# Patient Record
Sex: Male | Born: 1960 | Marital: Married
Health system: Southern US, Community
[De-identification: ages and names within clinical notes are randomized; demographics above are authoritative.]

---

## 2011-11-25 ENCOUNTER — Inpatient Hospital Stay: Payer: Self-pay | Admitting: Internal Medicine

## 2011-11-27 LAB — BASIC METABOLIC PANEL
Anion Gap: 11 (ref 7–16)
BUN: 8 mg/dL (ref 7–18)
Calcium, Total: 8 mg/dL — ABNORMAL LOW (ref 8.5–10.1)
Chloride: 112 mmol/L — ABNORMAL HIGH (ref 98–107)
Co2: 24 mmol/L (ref 21–32)
Creatinine: 0.71 mg/dL (ref 0.60–1.30)
EGFR (African American): >60
EGFR (Non-African Amer.): >60
Glucose: 93 mg/dL (ref 65–99)
Osmolality: 290 (ref 275–301)
Potassium: 3.9 mmol/L (ref 3.5–5.1)
Sodium: 147 mmol/L — ABNORMAL HIGH (ref 136–145)

## 2011-11-27 LAB — CBC WITH DIFFERENTIAL/PLATELET
Bands: 10 %
Comment - H1-Com1: NORMAL
Comment - H1-Com2: NORMAL
HCT: 36.5 % — ABNORMAL LOW (ref 40.0–52.0)
HGB: 12.5 g/dL — ABNORMAL LOW (ref 13.0–18.0)
MCH: 33.9 pg (ref 26.0–34.0)
MCHC: 34.2 g/dL (ref 32.0–36.0)
Monocytes: 17 %
Other Cells Blood: 100
Platelet: 120 10*3/uL — ABNORMAL LOW (ref 150–440)

## 2011-11-27 LAB — PHENYTOIN LEVEL, TOTAL: Dilantin: 12.1 ug/mL (ref 10.0–20.0)

## 2011-11-28 LAB — PHENYTOIN LEVEL, TOTAL: Dilantin: 11.7 ug/mL (ref 10.0–20.0)

## 2011-11-29 LAB — MAGNESIUM: Magnesium: 1.6 mg/dL — ABNORMAL LOW

## 2011-11-29 LAB — BASIC METABOLIC PANEL
BUN: 4 mg/dL — ABNORMAL LOW (ref 7–18)
Calcium, Total: 8.7 mg/dL (ref 8.5–10.1)
Chloride: 109 mmol/L — ABNORMAL HIGH (ref 98–107)
Co2: 25 mmol/L (ref 21–32)
EGFR (Non-African Amer.): >60
Glucose: 83 mg/dL (ref 65–99)
Osmolality: 283 (ref 275–301)
Potassium: 3.5 mmol/L (ref 3.5–5.1)
Sodium: 144 mmol/L (ref 136–145)

## 2011-11-29 LAB — PHOSPHORUS: Phosphorus: 1.4 mg/dL — ABNORMAL LOW (ref 2.5–4.9)

## 2011-11-30 DIAGNOSIS — I059 Rheumatic mitral valve disease, unspecified: Secondary | ICD-10-CM

## 2011-11-30 LAB — BASIC METABOLIC PANEL
Anion Gap: 10 (ref 7–16)
Calcium, Total: 8.2 mg/dL — ABNORMAL LOW (ref 8.5–10.1)
Creatinine: 0.52 mg/dL — ABNORMAL LOW (ref 0.60–1.30)
EGFR (African American): >60
EGFR (Non-African Amer.): >60
Glucose: 141 mg/dL — ABNORMAL HIGH (ref 65–99)
Osmolality: 286 (ref 275–301)
Sodium: 144 mmol/L (ref 136–145)

## 2011-11-30 LAB — PHOSPHORUS: Phosphorus: 2.8 mg/dL (ref 2.5–4.9)

## 2011-11-30 LAB — MAGNESIUM: Magnesium: 1.6 mg/dL — ABNORMAL LOW

## 2011-11-30 LAB — ALBUMIN: Albumin: 2.1 g/dL — ABNORMAL LOW (ref 3.4–5.0)

## 2011-11-30 LAB — PHENYTOIN LEVEL, TOTAL: Dilantin: 10.6 ug/mL (ref 10.0–20.0)

## 2011-12-01 LAB — BASIC METABOLIC PANEL
BUN: 5 mg/dL — ABNORMAL LOW (ref 7–18)
EGFR (African American): >60
Glucose: 123 mg/dL — ABNORMAL HIGH (ref 65–99)
Osmolality: 278 (ref 275–301)
Potassium: 3.1 mmol/L — ABNORMAL LOW (ref 3.5–5.1)

## 2011-12-01 LAB — CBC WITH DIFFERENTIAL/PLATELET
Basophil %: 0.3 %
Eosinophil #: 0 10*3/uL (ref 0.0–0.7)
HCT: 34.5 % — ABNORMAL LOW (ref 40.0–52.0)
HGB: 11.9 g/dL — ABNORMAL LOW (ref 13.0–18.0)
Lymphocyte #: 0.4 10*3/uL — ABNORMAL LOW (ref 1.0–3.6)
Lymphocyte %: 5.4 %
MCH: 33.8 pg (ref 26.0–34.0)
MCHC: 34.6 g/dL (ref 32.0–36.0)
MCV: 98 fL (ref 80–100)
Monocyte #: 1.5 10*3/uL — ABNORMAL HIGH (ref 0.0–0.7)
Neutrophil #: 5.9 10*3/uL (ref 1.4–6.5)
Platelet: 221 10*3/uL (ref 150–440)

## 2011-12-01 LAB — POTASSIUM: Potassium: 3.8 mmol/L (ref 3.5–5.1)

## 2011-12-01 LAB — MAGNESIUM: Magnesium: 2.4 mg/dL

## 2011-12-02 LAB — BASIC METABOLIC PANEL
Anion Gap: 10 (ref 7–16)
BUN: 7 mg/dL (ref 7–18)
Chloride: 110 mmol/L — ABNORMAL HIGH (ref 98–107)
Creatinine: 0.58 mg/dL — ABNORMAL LOW (ref 0.60–1.30)
EGFR (African American): >60
EGFR (Non-African Amer.): >60
Glucose: 101 mg/dL — ABNORMAL HIGH (ref 65–99)
Osmolality: 291 (ref 275–301)
Potassium: 3.8 mmol/L (ref 3.5–5.1)

## 2011-12-02 LAB — PHENYTOIN LEVEL, TOTAL: Dilantin: 5.7 ug/mL — ABNORMAL LOW (ref 10.0–20.0)

## 2011-12-02 LAB — HEMOGLOBIN: HGB: 11.9 g/dL — ABNORMAL LOW (ref 13.0–18.0)

## 2011-12-03 LAB — BASIC METABOLIC PANEL
Anion Gap: 9 (ref 7–16)
BUN: 7 mg/dL (ref 7–18)
Calcium, Total: 8.6 mg/dL (ref 8.5–10.1)
Creatinine: 0.69 mg/dL (ref 0.60–1.30)
EGFR (Non-African Amer.): >60
Glucose: 112 mg/dL — ABNORMAL HIGH (ref 65–99)
Osmolality: 287 (ref 275–301)
Potassium: 3.3 mmol/L — ABNORMAL LOW (ref 3.5–5.1)

## 2011-12-03 LAB — ALBUMIN: Albumin: 2.3 g/dL — ABNORMAL LOW (ref 3.4–5.0)

## 2011-12-03 LAB — POTASSIUM: Potassium: 4.5 mmol/L (ref 3.5–5.1)

## 2011-12-04 LAB — PHENYTOIN LEVEL, TOTAL: Dilantin: 5.3 ug/mL — ABNORMAL LOW (ref 10.0–20.0)

## 2011-12-04 LAB — CK: CK, Total: 24 U/L — ABNORMAL LOW (ref 35–232)

## 2011-12-04 LAB — BASIC METABOLIC PANEL
Calcium, Total: 9 mg/dL (ref 8.5–10.1)
Chloride: 107 mmol/L (ref 98–107)
Co2: 27 mmol/L (ref 21–32)
Creatinine: 0.61 mg/dL (ref 0.60–1.30)
EGFR (African American): >60
Glucose: 123 mg/dL — ABNORMAL HIGH (ref 65–99)
Potassium: 3.5 mmol/L (ref 3.5–5.1)
Sodium: 144 mmol/L (ref 136–145)

## 2011-12-05 LAB — CBC WITH DIFFERENTIAL/PLATELET
Eosinophil #: 0.1 10*3/uL (ref 0.0–0.7)
Eosinophil %: 0.9 %
HGB: 11.6 g/dL — ABNORMAL LOW (ref 13.0–18.0)
Lymphocyte #: 1.2 10*3/uL (ref 1.0–3.6)
MCH: 33.3 pg (ref 26.0–34.0)
MCHC: 33.8 g/dL (ref 32.0–36.0)
MCV: 98 fL (ref 80–100)
Monocyte #: 1 10*3/uL — ABNORMAL HIGH (ref 0.0–0.7)
Monocyte %: 7.4 %
Neutrophil #: 11.3 10*3/uL — ABNORMAL HIGH (ref 1.4–6.5)
Platelet: 420 10*3/uL (ref 150–440)
RDW: 16.2 % — ABNORMAL HIGH (ref 11.5–14.5)
WBC: 13.8 10*3/uL — ABNORMAL HIGH (ref 3.8–10.6)

## 2011-12-05 LAB — BASIC METABOLIC PANEL
Anion Gap: 10 (ref 7–16)
BUN: 6 mg/dL — ABNORMAL LOW (ref 7–18)
Calcium, Total: 8.7 mg/dL (ref 8.5–10.1)
Co2: 26 mmol/L (ref 21–32)
Creatinine: 0.65 mg/dL (ref 0.60–1.30)
Glucose: 106 mg/dL — ABNORMAL HIGH (ref 65–99)
Osmolality: 285 (ref 275–301)
Sodium: 144 mmol/L (ref 136–145)

## 2011-12-06 LAB — CBC WITH DIFFERENTIAL/PLATELET
Basophil #: 0 10*3/uL (ref 0.0–0.1)
Basophil %: 0.3 %
HGB: 11.4 g/dL — ABNORMAL LOW (ref 13.0–18.0)
Lymphocyte #: 1.2 10*3/uL (ref 1.0–3.6)
Lymphocyte %: 11.9 %
MCH: 33.3 pg (ref 26.0–34.0)
MCHC: 33.7 g/dL (ref 32.0–36.0)
MCV: 99 fL (ref 80–100)
Monocyte %: 8 %
Neutrophil #: 7.9 10*3/uL — ABNORMAL HIGH (ref 1.4–6.5)
Neutrophil %: 78.4 %
RBC: 3.4 10*6/uL — ABNORMAL LOW (ref 4.40–5.90)
WBC: 10.1 10*3/uL (ref 3.8–10.6)

## 2011-12-06 LAB — BASIC METABOLIC PANEL
Anion Gap: 8 (ref 7–16)
BUN: 7 mg/dL (ref 7–18)
Calcium, Total: 9.1 mg/dL (ref 8.5–10.1)
EGFR (African American): >60
EGFR (Non-African Amer.): >60
Glucose: 104 mg/dL — ABNORMAL HIGH (ref 65–99)
Osmolality: 285 (ref 275–301)
Sodium: 144 mmol/L (ref 136–145)

## 2011-12-06 LAB — CK TOTAL AND CKMB (NOT AT ARMC): CK-MB: <0.5 ng/mL — ABNORMAL LOW (ref 0.5–3.6)

## 2011-12-07 LAB — CBC WITH DIFFERENTIAL/PLATELET
Basophil #: 0 10*3/uL (ref 0.0–0.1)
Basophil %: 0.3 %
Eosinophil #: 0 10*3/uL (ref 0.0–0.7)
Eosinophil %: 0.5 %
HCT: 34.4 % — ABNORMAL LOW (ref 40.0–52.0)
HGB: 11.8 g/dL — ABNORMAL LOW (ref 13.0–18.0)
Lymphocyte %: 11.4 %
MCH: 33.4 pg (ref 26.0–34.0)
MCHC: 34.2 g/dL (ref 32.0–36.0)
Monocyte #: 0.7 10*3/uL (ref 0.0–0.7)
Neutrophil #: 7.1 10*3/uL — ABNORMAL HIGH (ref 1.4–6.5)
Neutrophil %: 79.6 %
RBC: 3.53 10*6/uL — ABNORMAL LOW (ref 4.40–5.90)

## 2011-12-07 LAB — BASIC METABOLIC PANEL
Anion Gap: 13 (ref 7–16)
BUN: 9 mg/dL (ref 7–18)
Calcium, Total: 8.9 mg/dL (ref 8.5–10.1)
Chloride: 109 mmol/L — ABNORMAL HIGH (ref 98–107)
EGFR (Non-African Amer.): >60
Glucose: 94 mg/dL (ref 65–99)
Osmolality: 285 (ref 275–301)

## 2011-12-07 LAB — TROPONIN I
Troponin-I: 0.03 ng/mL
Troponin-I: 0.04 ng/mL

## 2011-12-08 LAB — CBC WITH DIFFERENTIAL/PLATELET
Basophil %: 0.5 %
Eosinophil #: 0.1 10*3/uL (ref 0.0–0.7)
Eosinophil %: 0.9 %
HCT: 32.9 % — ABNORMAL LOW (ref 40.0–52.0)
HGB: 11.2 g/dL — ABNORMAL LOW (ref 13.0–18.0)
Lymphocyte #: 1.2 10*3/uL (ref 1.0–3.6)
Lymphocyte %: 14.5 %
MCH: 33.2 pg (ref 26.0–34.0)
Monocyte #: 0.9 10*3/uL — ABNORMAL HIGH (ref 0.0–0.7)
Monocyte %: 11 %
Neutrophil #: 5.9 10*3/uL (ref 1.4–6.5)
Neutrophil %: 73.1 %
Platelet: 606 10*3/uL — ABNORMAL HIGH (ref 150–440)
RBC: 3.37 10*6/uL — ABNORMAL LOW (ref 4.40–5.90)
WBC: 8 10*3/uL (ref 3.8–10.6)

## 2011-12-08 LAB — BASIC METABOLIC PANEL
Anion Gap: 12 (ref 7–16)
BUN: 10 mg/dL (ref 7–18)
Chloride: 107 mmol/L (ref 98–107)
Creatinine: 0.76 mg/dL (ref 0.60–1.30)
EGFR (African American): >60
EGFR (Non-African Amer.): >60
Glucose: 116 mg/dL — ABNORMAL HIGH (ref 65–99)
Osmolality: 291 (ref 275–301)
Sodium: 146 mmol/L — ABNORMAL HIGH (ref 136–145)

## 2011-12-08 LAB — TROPONIN I: Troponin-I: 0.04 ng/mL

## 2011-12-08 LAB — EXPECTORATED SPUTUM ASSESSMENT W GRAM STAIN, RFLX TO RESP C

## 2011-12-09 LAB — BASIC METABOLIC PANEL
Anion Gap: 10 (ref 7–16)
Calcium, Total: 9.6 mg/dL (ref 8.5–10.1)
Chloride: 107 mmol/L (ref 98–107)
Co2: 26 mmol/L (ref 21–32)
Creatinine: 0.65 mg/dL (ref 0.60–1.30)
EGFR (African American): >60
EGFR (Non-African Amer.): >60
Osmolality: 283 (ref 275–301)
Potassium: 3.3 mmol/L — ABNORMAL LOW (ref 3.5–5.1)
Sodium: 143 mmol/L (ref 136–145)

## 2011-12-09 LAB — CBC WITH DIFFERENTIAL/PLATELET
Basophil #: 0.1 10*3/uL (ref 0.0–0.1)
Basophil %: 1.6 %
HCT: 35.5 % — ABNORMAL LOW (ref 40.0–52.0)
Lymphocyte #: 1.5 10*3/uL (ref 1.0–3.6)
Lymphocyte %: 20.4 %
MCH: 32.6 pg (ref 26.0–34.0)
MCV: 98 fL (ref 80–100)
Monocyte %: 14.1 %
Neutrophil #: 4.4 10*3/uL (ref 1.4–6.5)
RDW: 14.8 % — ABNORMAL HIGH (ref 11.5–14.5)
WBC: 7.1 10*3/uL (ref 3.8–10.6)

## 2011-12-09 LAB — CLOSTRIDIUM DIFFICILE BY PCR

## 2011-12-10 LAB — BASIC METABOLIC PANEL
Anion Gap: 10 (ref 7–16)
BUN: 10 mg/dL (ref 7–18)
Calcium, Total: 9.8 mg/dL (ref 8.5–10.1)
Chloride: 106 mmol/L (ref 98–107)
Co2: 23 mmol/L (ref 21–32)
Creatinine: 0.63 mg/dL (ref 0.60–1.30)
Osmolality: 276 (ref 275–301)
Potassium: 3.8 mmol/L (ref 3.5–5.1)

## 2012-08-02 IMAGING — CR DG CHEST 1V PORT
1 series · 1 of 1 positions shown · non-contrast
Comparison: none

REASON FOR EXAM: seizure ams
COMMENTS:

PROCEDURE:     DXR - DXR PORTABLE CHEST SINGLE VIEW  - November 25, 2011 [DATE]
RESULT:     Comparison: None

[portable]
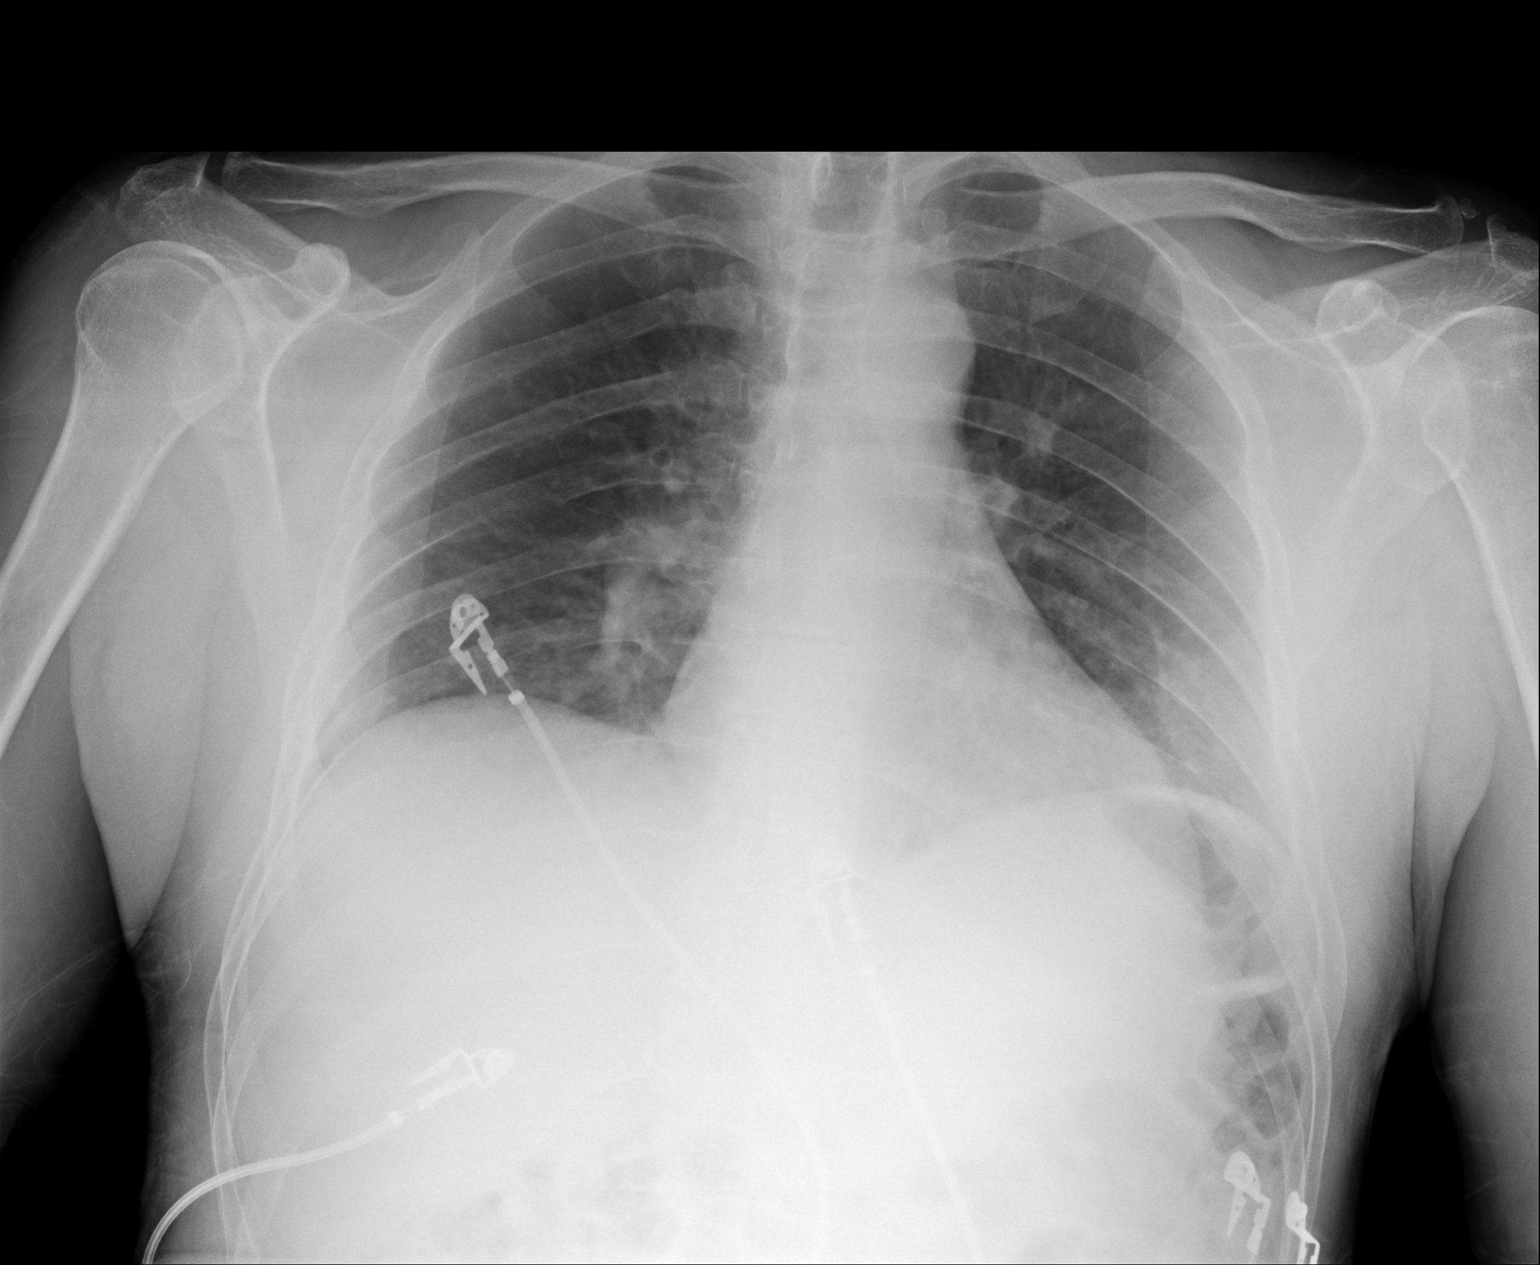

[1 of 1 positions shown; findings below may reference images not displayed]

FINDINGS: Single portable AP chest radiograph is provided.  There is no focal
parenchymal opacity, pleural effusion, or pneumothorax. Normal
cardiomediastinal silhouette. The osseous structures are unremarkable.
IMPRESSION: No acute disease of the chest.

## 2012-08-12 IMAGING — CR DG CHEST 1V PORT
1 series · 1 of 1 positions shown · non-contrast
Comparison: none

REASON FOR EXAM: iinterval changes
COMMENTS:

[portable]
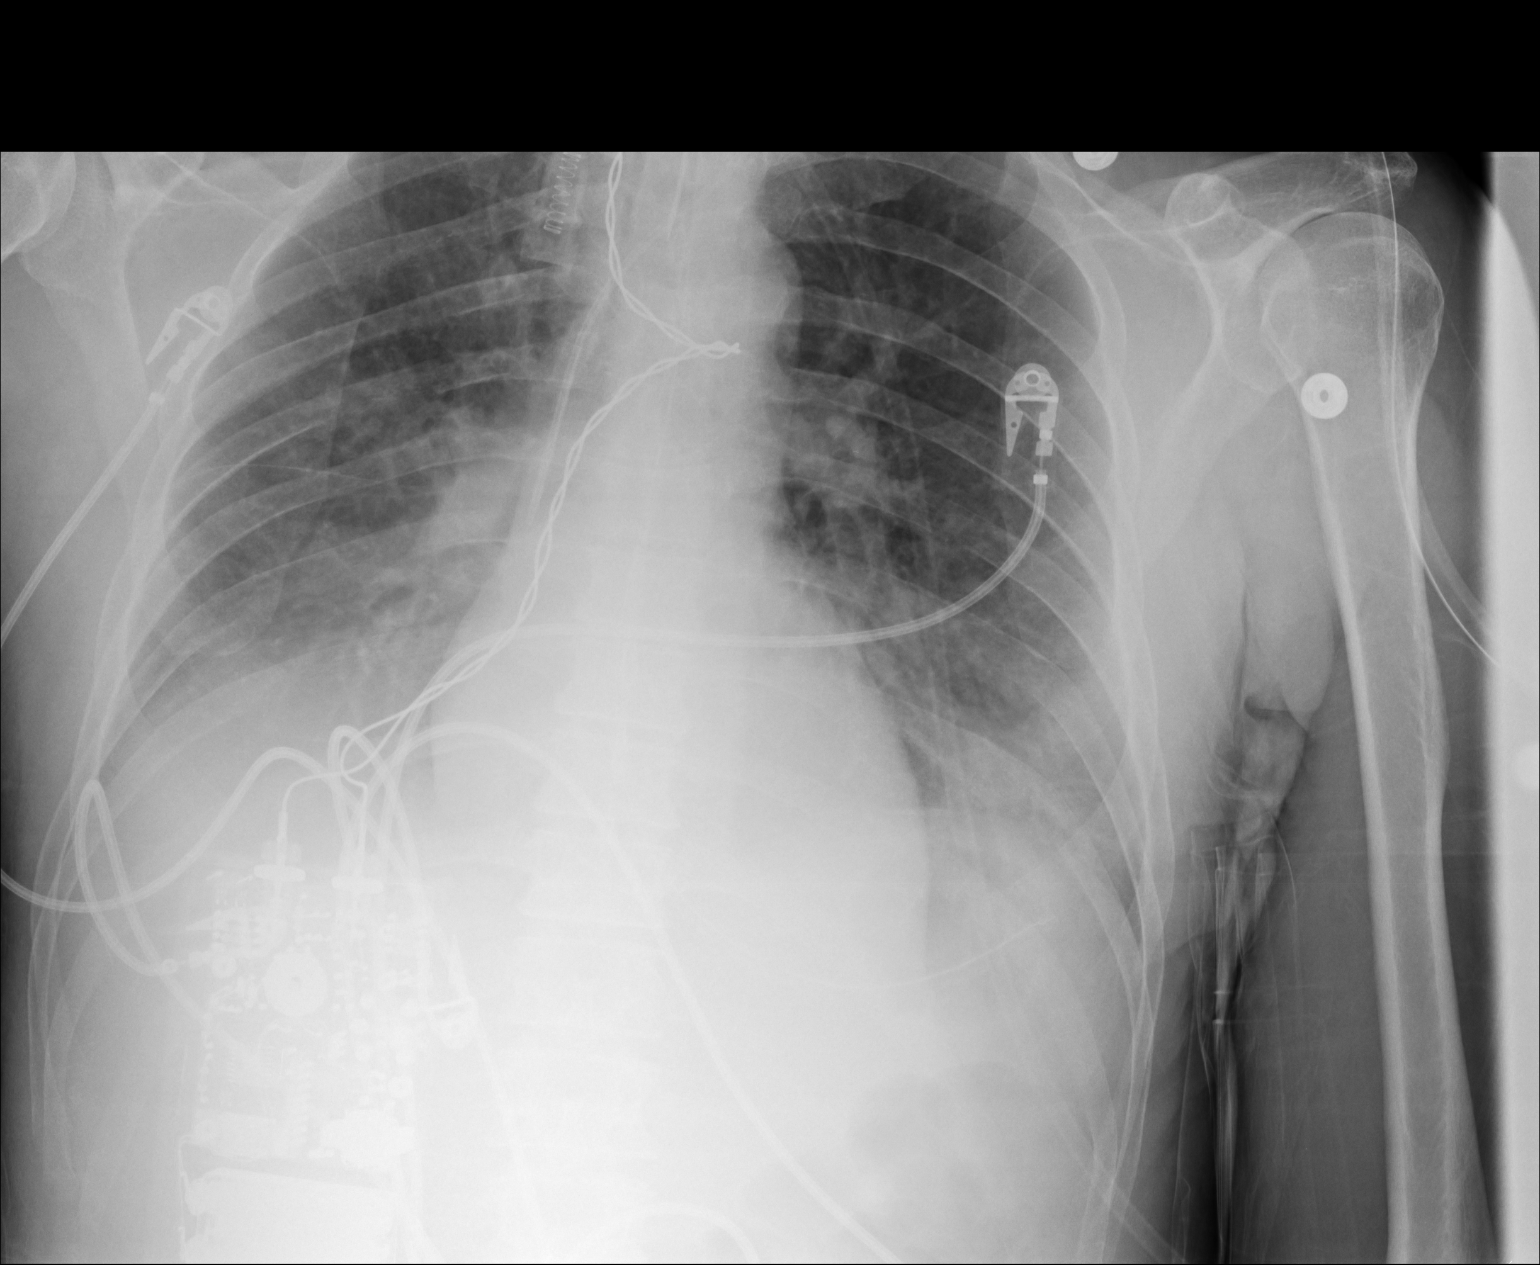

[1 of 1 positions shown; findings below may reference images not displayed]

PROCEDURE:     DXR - DXR PORTABLE CHEST SINGLE VIEW  - December 05, 2011 [DATE]

RESULT:     Comparison is made to the prior exam of 12/03/2011. There is
increased density at both lung bases, more prominent on the left and
compatible with atelectasis or pneumonia. Heart size is normal. No pulmonary
edema is seen. An endotracheal tube is present with the tip approximately
4.3 cm above the carina. A venous catheter is present with the distal
portion projected over the inferior aspect of the superior vena cava.
Monitoring electrodes are noted.
IMPRESSION: 1. There is a hazy increase in density in both lower lobes compatible with
atelectasis although early pneumonia cannot be entirely excluded at this
point.
2. Support lines are noted as mentioned above.

## 2015-03-20 NOTE — Consult Note (Signed)
PATIENT NAME:  Richard Dougherty, Richard Dougherty MR#:  [id number] DATE OF BIRTH:  [birth year]  DATE OF CONSULTATION:  11/25/2011  REFERRING PHYSICIAN:  Gladstone Lighter, MD CONSULTING PHYSICIAN:  Rudell Cobb. 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 He is referred for evaluation of seizures. History comes from his [facility] records and conversation with his wife.   In the late evening of 11/24/2011, the patient was witnessed by family members to fall on the floor backward with blow to the back of the head and to have tonic-clonic generalized seizure including bite of the tongue. He had fluid coming from the mouth so he was placed onto his side to avoid aspiration. After the seizure, he was lethargic. In the emergency room, he was intubated to protect his airway in the setting of described significant agitation with confused and combative behavior. In the Critical Care Unit, he has required significant sedation with IV Ativan, fentanyl, and Versed with continued tremors to the upper extremities. Brain CT scan was reported benign.   His wife reports that he has long history of alcohol abuse and this has been significantly increased in recent years in the setting of his wife's diagnosis of breast cancer in 2007 with treatment ending in 2008, in the setting of economic downturn and financial stressors. He drinks beer.  She does not know how many beers he has a day, but reports that his credit card records indicate spending 30 dollars a day on beer. He also uses NyQuil off and on during the day and Benadryl. It is reported that in recent months he does not go an hour without a beer. He last had beer the morning of Thursday, 11/22/2011, before 9 o'clock  a.m. beginning of car trip to drive down for visit with family  members in [place]. It is reported he has had an increase over his baseline level of tremor on the evening of 11/22/2011 when met by family members here. He spent the day in bed on 11/23/2011. On 11/24/2011 he was noted to have more tremor, and then had a witnessed seizure in the evening. His wife reports that he had a one week period of alcohol detoxification rehab approximately two years prior to admission. He has no prior history of seizures. He has no prior history of cold [place] discontinuation of alcohol. It is not clear whether he also discontinued NyQuil and Benadryl during the two days prior to admission.   PAST MEDICAL HISTORY: He started a medication for hypertension a couple of years ago. He had not had problems with this medication for two weeks prior to admission after running out of medication. He has not had other medical problems. He is on no regular medications aside from his blood pressure medication. He does take a daily multivitamin.   HABITS: He is a nonsmoker and has history of alcohol abuse as noted above.  DRUG ALLERGIES: No known drug allergies.   SOCIAL/FAMILY HISTORY: He is a Restaurant manager, fast food and lives with his wife of 21 years in [place], [place]. They have 13 and 95 year-old daughters. He is one of eight brothers, one of five who are chiropractors. Two brothers have passed away. One shortly after birth and the other at age 29 from  myocardial infarction. There is history of alcohol problems in the patient's father who is  now 30 and history of death at age 74 of the patient's father's father, concluded secondary to the effects of alcohol intoxication.   PHYSICAL EXAMINATION: The patient is a well developed, well nourished, white gentleman who was examined lying semisupine in the Critical Care Unit on the ventilator with respiratory rate 20 and IMV rate from the ventilator at 14 breaths per minute. Blood pressure is 114/68 with heart rate 86. He was afebrile. There was bruise  of the tongue. Head was otherwise normocephalic without evidence of trauma. His neck was supple to passive movement. Examination was notable for near continual tremor of the hands, greater transiently after stimulations. Pupils were equal and normally reactive from 3 to 2 mm. Cortical responses were present. Doll's response was mildly present. Some facial grimace was seen bilaterally to intranasal noxious stimulation. There were no elicitable voluntary movements of the extremities. Tongue was mildly increased throughout. Reflexes were rated 2+ throughout.   IMPRESSION:  1. Clinical picture most consistent with alcoholic withdrawal seizure.  2. Encephalopathy at the time of admission secondary to a combination of postictal state, expect concussion associated with closed head injury from fall, and alcohol withdrawal. 3. At present he is very poorly responsive in the setting of significant sedation.   RECOMMENDATIONS:  1. He will be loaded with IV phenytoin to cover the possibility of withdrawal seizures for the present. At this point his clinical picture is most consistent with acute symptomatic seizures from alcohol withdrawal, so that at discharge he will be given a schedule to taper off of Dilantin.  2. I agree with his present work-up and treatment including alcohol withdrawal protocol.  3. He will need psychiatric evaluation and treatment.  4. I do not see any indication at this time for lumbar puncture.  5. I will follow his course in the hospital. At this point I do not see any indication for repeat brain CT scan while on the ventilator and do not see indication for EEG. Once he is off the ventilator, he should have brain MRI scan.   I appreciate being asked to see this interesting gentleman. ____________________________ Rudell Cobb. Loletta Specter, MD prc:slb D: 11/26/2011 09:50:00 ET T: 11/26/2011 11:04:47 ET JOB#: [id number]  cc: Rudell Cobb. Loletta Specter, MD, <Dictator> Linton Flemings MD ELECTRONICALLY SIGNED  11/29/2011 10:17

## 2015-03-20 NOTE — Discharge Summary (Signed)
PATIENT NAME:  Richard Dougherty, Richard Dougherty MR#:  [id number] DATE OF BIRTH:  [birth year]  DATE OF ADMISSION:  11/25/2011 DATE OF DISCHARGE:  12/11/2011  PRIMARY CARE PHYSICIAN: Nonlocal, in [place].   REASON FOR ADMISSION: Seizures.   DISCHARGE DIAGNOSES:  1. Alcohol withdrawal seizures.  2. Severe delirium tremens.  3. Alcohol withdrawal.  4. Acute respiratory failure secondary to inability to protect airway due to severe delirium tremens and alcohol withdrawal seizures requiring intubation/mechanical ventilation. The patient was successfully extubated on 12/06/2011.  5. Methicillin-sensitive Staphylococcus aureus pneumonia, possibly aspiration versus ventilator-associated pneumonia.  6. 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  7. Diarrhea felt to be from antibiotic side effect.  Clostridium difficile negative. Diarrhea, resolved.  8. Alcohol abuse.   9. 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  10. History of alcohol abuse.   11. History of anxiety.   DISCHARGE DISPOSITION: Home.   DISCHARGE MEDICATIONS:  1. Metoprolol 50 mg p.o. q.12 hours.  2. Norvasc 5 mg.   3. Thiamine 100 mg p.o. daily.   4. Folate 1 mg p.o. daily.   5. Multivitamin 1 tablet p.o. daily.   6. Ativan 1 mg p.o. q.8 hours p.r.n. x1 week until followup with primary M.D.   DISCHARGE ACTIVITY: As tolerated.   DISCHARGE DIET: Low sodium, low fat, low cholesterol.   DISCHARGE INSTRUCTIONS:  1. Take medication prescribed.  2. Return to the emergency department for symptoms.   3. Do not consume any alcohol.   FOLLOWUP INSTRUCTIONS:  1. Follow up with your primary M.D. in [place] within 1 to 2 weeks.  Patient's primary M.D. 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  2. Patient instructed to enroll in outpatient alcohol rehab program in [place] as soon as possible.     CONSULTANTS  DURING THIS HOSPITALIZATION:  1. Pulmonary critical care with Dr Belia HemanKasa.   2. Vascular surgery with Dr. Wyn Quakerew for central line insertion.  3. Neurology consultation with Dr. Kemper Durielarke.   4. Psychiatric consultation with Dr. Toni Amendlapacs.   PROCEDURES: None.   PROCEDURES AND PERTINENT LABORATORY DATA: Please refer to interim discharge summary dictated by Dr. Nemiah CommanderKalisetti from 12/07/2011. Additional laboratories and studies as follows:  EEG 12/05/2011, abnormal EEG due to poorly formed background activity and delta swelling.  Possible epileptiform discharges in left temporal region could be muscle artifact and epileptiform discharges were not associated with jerking movements.   BMP normal from 12/10/2011.    Cardiac enzymes are negative x3 sets.   C. difficile toxin was negative from 12/06/2011 and 10/13/201.  Sputum culture 12/04/2011,  moderate growth of methicillin susceptible Staphylococcus aureus. Blood cultures x2 from 11/27/2011 no growth to date. Echocardiogram 11/30/2011, LV is not well visualized; LV systolic function is mildly reduced; ejection fraction 40% to 45%.  RV systolic function is normal.  Left atrium size is normal. RV systolic pressure is normal.   BRIEF HISTORY/HOSPITAL COURSE: The patient is a 54 year old male with past medical history of severe alcohol abuse and hypertension who presented to the emergency department secondary to seizures. Please see dictated admission history and physical for pertinent details surrounding the onset of this hospitalization. Additionally, please refer to Dr. Prudencio PairKalisetti's dictated interim discharge summary from 12/07/2011 for details surrounding the majority of patient's hospitalization. This summary serves to provide additional details from the time when I was rounding on the patient from 12/08/2011 to the day of discharge (12/11/2011). Please see below.  Seizures  and severe DTs from alcohol withdrawal with acute respiratory failure and inability to protect  airway due to severe DTs, alcohol withdrawal, and seizures requiring intubation and mechanical ventilation for which the patient was followed by Pulmonary and Critical Care and was successfully extubated 12/06/2011.   He was kept on benzodiazepine therapy for his alcohol withdrawal, seizures, and DTs. He has done well with benzodiazepines. He has been successfully detoxed and did not require much Ativan at all at this time. He was maintained on Ativan per CIWA, and benzodiazepines are currently being tapered. He was followed by Psychiatry and Neurology as well. Initial EEG showed evidence of toxic metabolic encephalopathy versus post ictal phase; and repeat EEG revealed an epileptiform focus not corresponding to whole body jerking, and this was felt to be artifactual and possibly from tremors and less likely to be true underlying seizure disorder per Neurology, and Neurology felt that patient had alcohol withdrawal seizures rather than a true seizure disorder and thereafter Keppra was discontinued and Dr. Kemper Durie did not feel that patient needed any maintenance antiepileptic therapy and was in agreement with continuation of benzodiazepine taper until patient was successfully detoxed from alcohol.   The patient has detoxed well. He was advised to no longer consume alcohol again in the future, will need to enroll in outpatient alcohol rehabilitation program, which his wife is helping to arrange in [place].  Patient is agreeable to going to alcohol rehab. He will also be on thiamine, folate, and multivitamin therapy for now given his history of alcohol abuse. Overall psychiatry, neurology as well as pulmonary critical care and all specialists were in agreement with overall medical management plan.   Patient also was noted to have methicillin susceptible Staphylococcus aureus pneumonia. This was felt to be either aspiration versus ventilator associated pneumonia. The patient has completed appropriate  antibiotics. He was initially on vancomycin and Zosyn, and after cultures were reviewed he was switched to oral Augmentin, and methicillin susceptible Staphylococcus aureus was sensitive to vancomycin, Zosyn, as well as amoxicillin. The patient has completed her appropriate antibiotics and has completed a 10 day course of inpatient antibiotics and his pneumonia has clinically resolved.   Malignant hypertension. This has also resolved and this is due to alcohol withdrawal. Blood pressure is now at goal on the day of discharge after adjusting medications, and he will continue metoprolol, Norvasc as an outpatient and his alcohol withdrawal was managed with benzodiazepines as above.   Diarrhea, short lasted, felt be antibiotic side effect. Clostridium difficile was negative, and diarrhea has resolved.   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 The patient is currently well compensated and he is currently euvolemic, therefore does not require diuretics at this time and did not acutely start ACE inhibitor. Patient will need referral to Cardiology as an outpatient in [place] which can be arranged by his primary care physician.  For cardiomyopathy, he will continue beta blocker which he is currently on for hypertension control.   Per PT's initial evaluation there is recommendation for short-term rehab. However, the patient's strength has significantly improved during this hospital course.  PT continued to work with the patient, and patient has done well and is able to perform his ADLs and also is ambulating well without any assistance. Upon PT re-evaluation it was felt to be safe to have the patient discharged home, and PT did not feel that patient required any short-term rehab or skilled nursing facility or any home health services. On 12/11/2011 the  patient was medically and hemodynamically stable and without any seizures and was felt to be stable for discharge  home with close outpatient followup to which the patient was agreeable. Case was also discussed with the patient's parents as well as his wife, all of whom are in agreement with the patient being discharged home back to [place]; and thereafter he was discharged home in hemodynamically stable condition.   TIME SPENT ON DISCHARGE:  Greater than 30 minutes.     ____________________________ Elon Alas, MD knl:vtd D: 12/14/2011 17:49:27 ET Dougherty: 12/15/2011 11:02:47 ET JOB#: [id number]  cc: Elon Alas, MD, <Dictator> Primary physician in [facility] Sandro Burgo MD ELECTRONICALLY SIGNED 12/25/2011 18:45
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
## 2015-03-20 NOTE — Consult Note (Signed)
Details:    - Psychiatry: PAtient seen for follow up. He is much more energetic and interactive today compared to Friday. He has been able to get up and walk and has his voice back. He reports his mood is still anxious about leaving the hospital but he feels confident about it. No complaint of depression. He states he plans to leave the hospital tomorrow and go back toward home with his parents. He then plans to go to a SA treatment facility in [place]. I reminded him of the high risk of seizures or DTs if he were to return to drinking and also the serious risk of relapse. He understands this and agrees to the importance of going to SA treatment. He appears lucid and capable of making reasonable decisions about his care.   No change to medication. I would advise that he not be continued on any benzodiazapines at discharge to avoid the possibility of further withdrawl or of abuse. Will follow up if he is still here tomorrow.   Electronic Signatures: Audery Amellapacs, John T (MD)  (Signed 14-Jan-13 18:55)  Authored: Details   Last Updated: 14-Jan-13 18:55 by Audery Amellapacs, John T (MD)
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
## 2015-03-20 NOTE — Consult Note (Signed)
Brief Consult Note: Diagnosis: alcohol dependence/withdrawl.   Patient was seen by consultant.   Consult note dictated.   Recommend further assessment or treatment.   Orders entered.   Comments: Psychiatry: Patient seen. Chart reviewed though I have not read it all. Patient her sp a seizure from alcohol withdrawl and then prolonged intubbation. Just extubabted today. Pt throat still v sore, hard to have much conversation. Still tachycardic and hypertensive and shaky but calm and not delirious.  The time has been such that he would be expected to be past alcohol withdrawl. Part of what he is still having could be withdrawl from versed or result of being so sick or maybe still alcohol withdrawl. Patient did indicate insight into alcohol problem and intent to stop drinking.  I will dc xanax and valium to simplfy the bezos. No need to order 3 different ones. Put on ativan oral 2mg  q8 for now. Will reevaluate tomorrow. Will discuss with our SA team and talk with family about longer term SA treatment options.  Electronic Signatures: Audery Amellapacs, John T (MD)  (Signed 10-Jan-13 17:19)  Authored: Brief Consult Note   Last Updated: 10-Jan-13 17:19 by Audery Amellapacs, John T (MD)

## 2015-03-20 NOTE — Consult Note (Signed)
PATIENT NAME:  Richard Dougherty, Richard Dougherty#:  [id number] DATE OF BIRTH:  1961-04-20  DATE OF CONSULTATION:  11/25/2011  CONSULTING PHYSICIAN:  Annice NeedyJason S. Ragnar Waas, MD  HISTORY OF PRESENT ILLNESS: The patient is a 54 year old white male who was admitted with altered mental status, alcohol withdrawal and seizures, who is intubated on the ventilator on multiple sedating drips, and needs a line for durable venous access. He cannot provide the history. This is obtained from the previous medical records.   PAST MEDICAL HISTORY:  1. Hypertension.  2. Heavy alcohol dependence.   ALLERGIES: No known drug allergies.   MEDICATIONS: Apparently Benicar and other blood pressure medicines which were not known at the time of the History and Physical, and he cannot provide the history.   SOCIAL HISTORY: He works as a Landchiropractor. He is apparently a daily alcoholic. He is married and lives with his wife.   FAMILY HISTORY: Father is in his 5170s, also has alcoholism, as did the grandfather.   REVIEW OF SYSTEMS: Review of systems is unobtainable due to him being on the ventilator.   PHYSICAL EXAMINATION:  GENERAL: The patient is a small well-developed, well-nourished white male who is lying quietly in bed on the ventilator on sedative drips.   VITAL SIGNS: Temperature 98.5, pulse 94, blood pressure 112/65, saturations are 100% on the ventilator.   HEENT: Normocephalic, atraumatic. Orogastric and endotracheal tube is in place.   NECK: Supple without adenopathy or jugular venous distention. Carotids have normal upstroke without bruit.   HEART: Regular rate and rhythm without murmur.   LUNGS: Clear bilaterally, but accessory sounds from the ventilator are heard.   ABDOMEN: Soft, nondistended, unable to assess tenderness.   EXTREMITIES: Warm and well perfused without cyanosis, clubbing, or edema. He has palpable radial and pedal pulses.   NEUROLOGICAL: Exam is limited due to his heavy sedation, but he seems to respond  only to painful stimuli and limited at that at this point.   LABORATORY, DIAGNOSTIC AND RADIOLOGICAL DATA:  Sodium 143, potassium is 3.3 this morning, replaced up to 4.0. Chloride is 105, CO2 is 26, BUN is 9, creatinine 0.66, glucose is 114. His urine drug screen was negative. His white blood cell count was 4.7, hemoglobin was 15.4, platelet count was 96,000. His INR was 1.0.   ASSESSMENT AND PLAN: The patient is a 54 year old white male with need for a central line due to multiple ongoing drips on the ventilator. This will be placed at the bedside.   This is a level-3 consultation.   ____________________________ Annice NeedyJason S. Shephanie Romas, MD jsd:cbb D: 11/25/2011 15:18:23 ET T: 11/25/2011 15:40:25 ET JOB#: [id number]  cc: Annice NeedyJason S. Garnette Greb, MD, <Dictator> Annice NeedyJASON S Yania Bogie MD ELECTRONICALLY SIGNED 12/29/2011 15:47

## 2015-03-20 NOTE — Op Note (Signed)
PATIENT NAME:  Richard Dougherty, Richard Dougherty MR#:  [id number] DATE OF BIRTH:  1961/11/04  DATE OF PROCEDURE:  11/25/2011  PREOPERATIVE DIAGNOSES:  1. Altered mental status with alcohol withdrawal.  2. Poor venous access on multiple drips for sedation and potential seizures.  POSTOPERATIVE DIAGNOSES: 1. Altered mental status with alcohol withdrawal.  2. Poor venous access on multiple drips for sedation and potential seizures.  PROCEDURES:  1. Ultrasound guidance for vascular access, right jugular vein.  2. Placement of right jugular triple lumen catheter.   SURGEON: Annice NeedyJason S. Dew, MD   ANESTHESIA: Local.   ESTIMATED BLOOD LOSS: Minimal.   INDICATION FOR PROCEDURE: This is a 54 year old white male admitted with altered mental status. He apparently is a heavy alcoholic and was trying to quit cold [place]. He was combative last night and is intubated on multiple drips for sedation as well as alcohol withdrawal and potential seizures. He needs a line for venous access. Risks and benefits were discussed with the family. Informed consent was obtained.   DESCRIPTION OF PROCEDURE: The patient was laid flat in the critical care bed. His right neck was sterilely prepped and draped and a sterile surgical field was created. The right jugular vein was visualized with ultrasound and found to be widely patent. It was then accessed under direct ultrasound guidance without difficulty with a Seldinger needle. A 3-J wire was placed after skin nick and dilatation. The triple lumen catheter was placed over the wire and the wire was removed. All three ports withdrew blood well and flushed easily with sterile saline. It was secured at 18 cm to the skin with three silk sutures.     Stat chest x-ray showed the tip in the superior vena cava in excellent location.   ____________________________ Annice NeedyJason S. Dew, MD jsd:drc D: 11/25/2011 15:15:42 ET Dougherty: 11/25/2011 15:35:23 ET JOB#: [id number]  cc: Annice NeedyJason S. Dew, MD, <Dictator> Annice NeedyJASON S  DEW MD ELECTRONICALLY SIGNED 12/29/2011 15:47

## 2015-03-20 NOTE — Consult Note (Signed)
PATIENT NAME:  [name], Yecheskel T MR#:  [id number] DATE OF BIRTH:  09/02/1961  DATE OF CONSULTATION:  12/06/2011  REFERRING PHYSICIAN:   CONSULTING PHYSICIAN:  Audery AmelJohn T. Atlas Crossland, MD  IDENTIFYING INFORMATION AND REASON FOR CONSULT: This is a 54 year old gentleman who has been in the hospital for about 1-1/2 weeks on a ventilator after suffering a seizure presumably due to alcohol withdrawal. When he was brought into the Emergency Room he required mechanical ventilation and had remained on a ventilator delirious for many days. He was just extubated yesterday, and I was asked to come and see him for evaluation of alcohol withdrawal.   HISTORY OF PRESENT ILLNESS: Information was obtained from interviewing the patient, speaking with his wife and looking at the chart. The [place] in [place] extended family when he suffered a seizure at home. He was brought to the hospital and was apparently unresponsive, not breathing well and required intubation and mechanical ventilation. He required continued ventilation and treatment in the Critical Care Unit up until 12/06/2011. The patient states that he does not remember anything prior to waking up in the hospital. The last thing he remembers is being at his brother-in-law's home. He states that he does drink heavily. He estimates his drinking at about 15 beers a day, an amount that his wife confirms. He had evidently cut down on his drinking or stopped briefly resulting in the seizure. The patient is currently stating that his mood mostly feels tired and anxious. He is not yet able to evaluate how well he is sleeping. He is just now recovering from being ventilated. His throat is very sore. He is able to discuss his drinking pattern and his outpatient problems. The patient says that he feels like he normally drinks "medicinally" because he has chronic anxiety. He says that his marriage is very stressful that he feels stressed having to provide care for and  money for his family including his children and that he works as a Landchiropractor at two different clinics. He says that he has tried to stop drinking in the past but has not stayed off it for very long. He denies abuse of any other drugs. He does not think that he feels severely depressed. He has apparently never been on an antidepressant.   PAST PSYCHIATRIC HISTORY: He has had brief rehabilitation stays and has participated in some Alcoholic's Anonymous groups but has not maintained extended sobriety. His drinking problem has been going on for a couple of years at least. He denies any past suicide attempts. He denies any history of psychosis. He has never been in a psychiatric hospital other than a rehab facility or substance abuse facility. He has never been on antidepressant medicine.   PAST MEDICAL HISTORY: The patient has blood pressure problems at baseline, appears to have been taking Benicar. Other details are not currently known at this point.   FAMILY HISTORY: He has significant family history of alcohol dependence.   SOCIAL HISTORY: The patient is a Landchiropractor from the state [place]. He was [place] in [place] when this happened. Apparently he has still been working throughout the time that he has been drinking heavily. He says that he and his wife of 20 years have some stress in their marriage. He has tried going to therapy for it, but does not think it has been helpful.   REVIEW OF SYSTEMS: Currently complaining of a sore throat, fatigue, tiredness, feeling jittery, and feeling anxious. Otherwise noncontributory.  MENTAL STATUS EXAMINATION: The patient was interviewed in a hospital room, first in his Critical Care Unit and the on the regular ward. He was cooperative and attentive. He was awake throughout the exam. He made good eye contact. Psychomotor activity was limited. Speech was very quiet and difficult to understand because of how sore his throat is, decreased in  tone and total production. Affect appears anxious and blunted. Mood is stated as being worried. Thoughts appear lucid but slow. No evidence of psychotic thinking. Denies hallucinations. Denies suicidal or homicidal ideation. Shows at least adequate judgment and insight.   PHYSICAL EXAM: A full physical exam was not done, but I did note when I first evaluated him that his pulse continued to be over 100, his blood pressure was also up and he had a tremor. Even though it has been about 12 days since his last drink, there was concern about their possibly being some withdrawal still going on. Part of it could be withdrawal from the Versed drip that he had been on while he was ventilated.   ASSESSMENT: This is a 54 year old man with alcohol dependence who suffered an alcohol withdrawal seizure with complications leading to extended mechanical ventilation at the time in the Critical Care Unit. He seems to be recovering now and has been able to come off the ventilator. He still is slow and sluggish and a bit sedated, but it looks like he is getting better. I had initially gone ahead and ordered 2 mg of Ativan three times daily as a standing thing because of concern about possible withdrawal still going on. I see that he is doing better today and I am going to cut that down and stop it over the next day. The patient has already said that he would be agreeable and does think that he needs alcohol rehabilitation treatment. At this point, it is not clear whether he needs specific mental health treatment for any other issues. He does not report any suicidality and does not appear to be acutely dangerous to himself. He is showing cooperation. At this point, he probably would not benefit from any treatment on the psychiatry ward, but we will be happy to work with him on finding rehabilitation treatment.   TREATMENT PLAN: I spent time with him doing cognitive, educational, and supportive therapy around substance abuse and  anxiety. The patient is agreeable to going to inpatient rehab treatment, which I do think would be appropriate given that he has the resources for it. I have spoken with the substance abuse counselor from behavioral health who is going to assist the patient and his wife in locating appropriate treatment programs. I will continue to follow up with him if he is in the hospital after the weekend. I did put him on a standing dose of Ativan, but he seems to be recovering well, so I am going to taper that off over the next day.   DIAGNOSIS PRINCIPLE AND PRIMARY:   AXIS I: Alcohol dependence.   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   AXIS II: Deferred.   AXIS III: Alcohol withdrawal with delirium, now resolving.   AXIS IV: Moderate to severe - acute stress from the illness and chronic stress from his anxiety at home.   AXIS V: Functioning at time of evaluation 36. ____________________________ Audery Amel, MD jtc:slb D: 12/07/2011 17:43:00 ET     T: 12/08/2011 09:57:46 ET        JOB#: [id number] cc: Jonny Ruiz  Loletha Carrow, MD, <Dictator> Audery Amel MD ELECTRONICALLY SIGNED 12/10/2011 22:48
